# Patient Record
Sex: Female | Born: 1935 | Race: White | Hispanic: No
Health system: Southern US, Community
[De-identification: ages and names within clinical notes are randomized; demographics above are authoritative.]

---

## 2005-01-06 ENCOUNTER — Ambulatory Visit: Payer: Self-pay | Admitting: Internal Medicine

## 2005-10-10 ENCOUNTER — Ambulatory Visit: Payer: Self-pay

## 2005-10-14 ENCOUNTER — Encounter: Payer: Self-pay | Admitting: General Practice

## 2005-10-21 ENCOUNTER — Ambulatory Visit: Payer: Self-pay | Admitting: Unknown Physician Specialty

## 2005-10-29 ENCOUNTER — Inpatient Hospital Stay: Payer: Self-pay | Admitting: Unknown Physician Specialty

## 2005-10-29 ENCOUNTER — Other Ambulatory Visit: Payer: Self-pay

## 2006-03-02 ENCOUNTER — Ambulatory Visit: Payer: Self-pay | Admitting: Internal Medicine

## 2006-06-26 ENCOUNTER — Other Ambulatory Visit: Payer: Self-pay

## 2006-06-27 ENCOUNTER — Inpatient Hospital Stay: Payer: Self-pay | Admitting: Internal Medicine

## 2006-10-30 IMAGING — CR DG CHEST 2V
1 series · 2 of 2 positions shown · non-contrast
Comparison: none

REASON FOR EXAM: HTN, cancer.   Call report to SDS [id number]
COMMENTS:

[Series 1: view not recorded · 0.17mm/px · 2 of 2 slices shown]
[im 1/2]
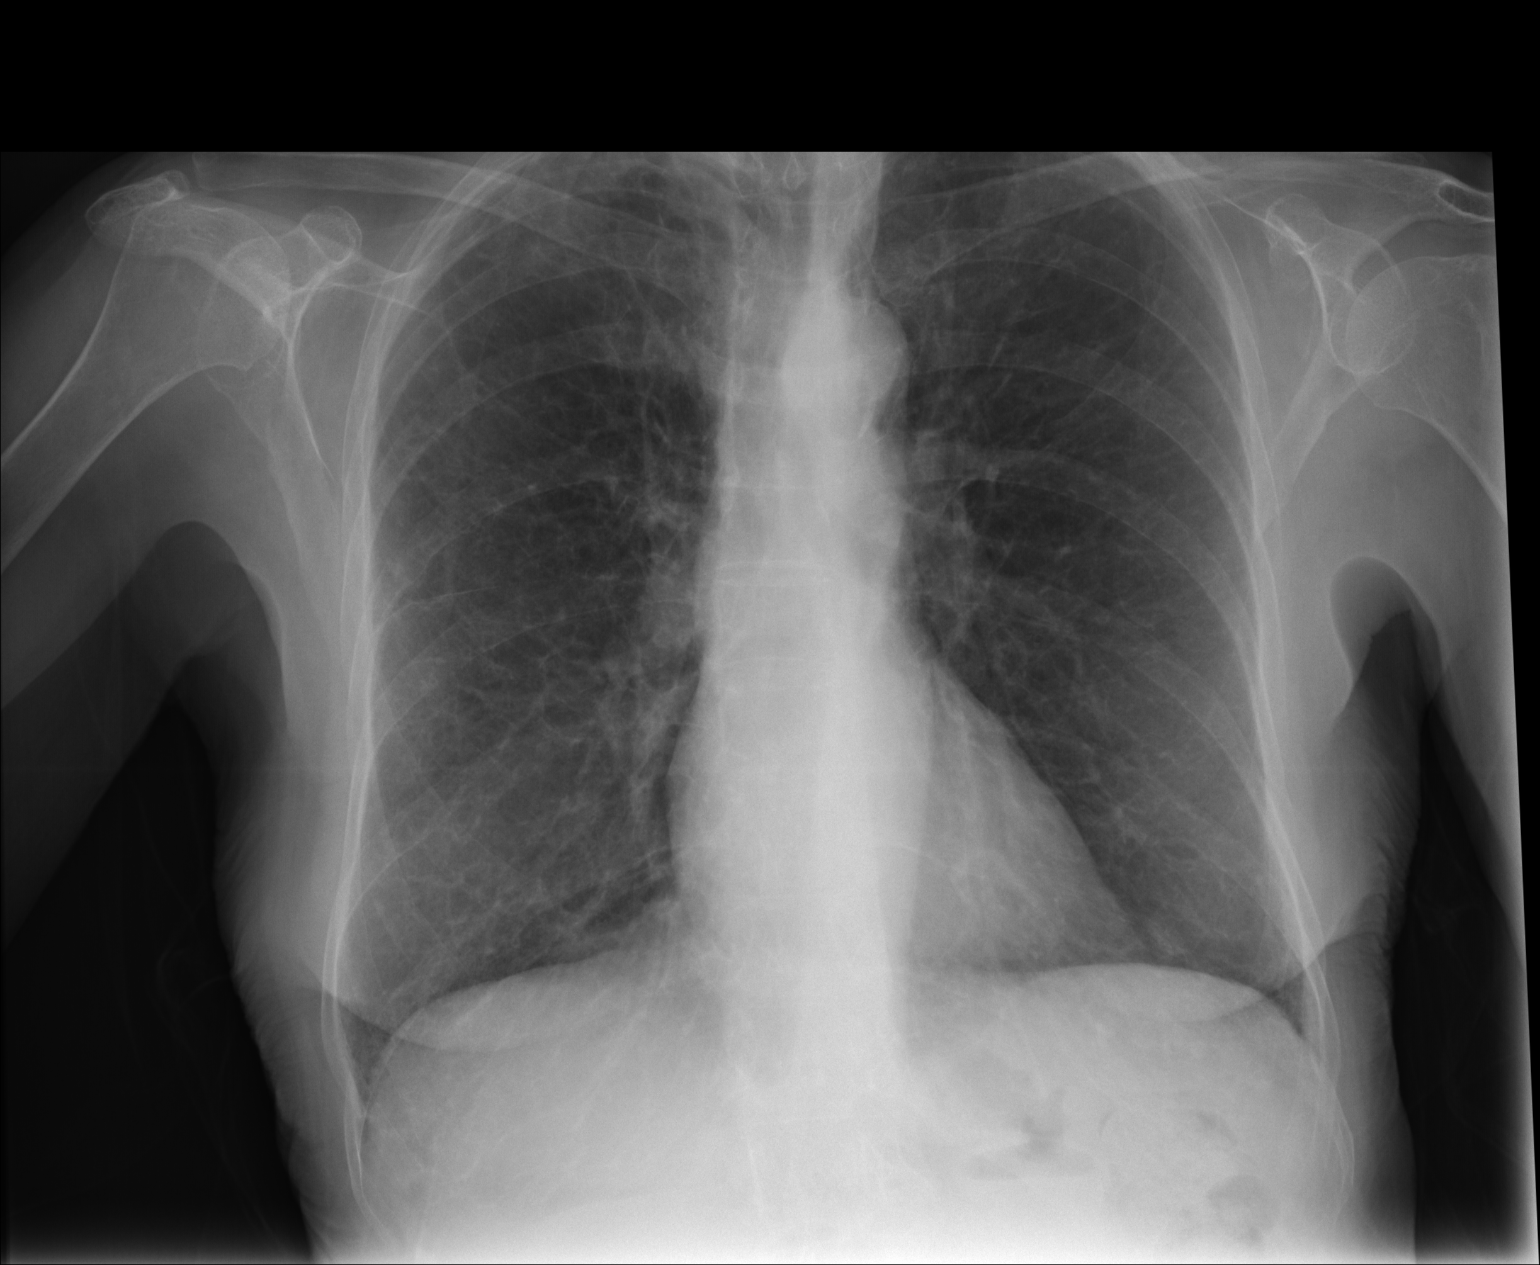
[im 2/2]
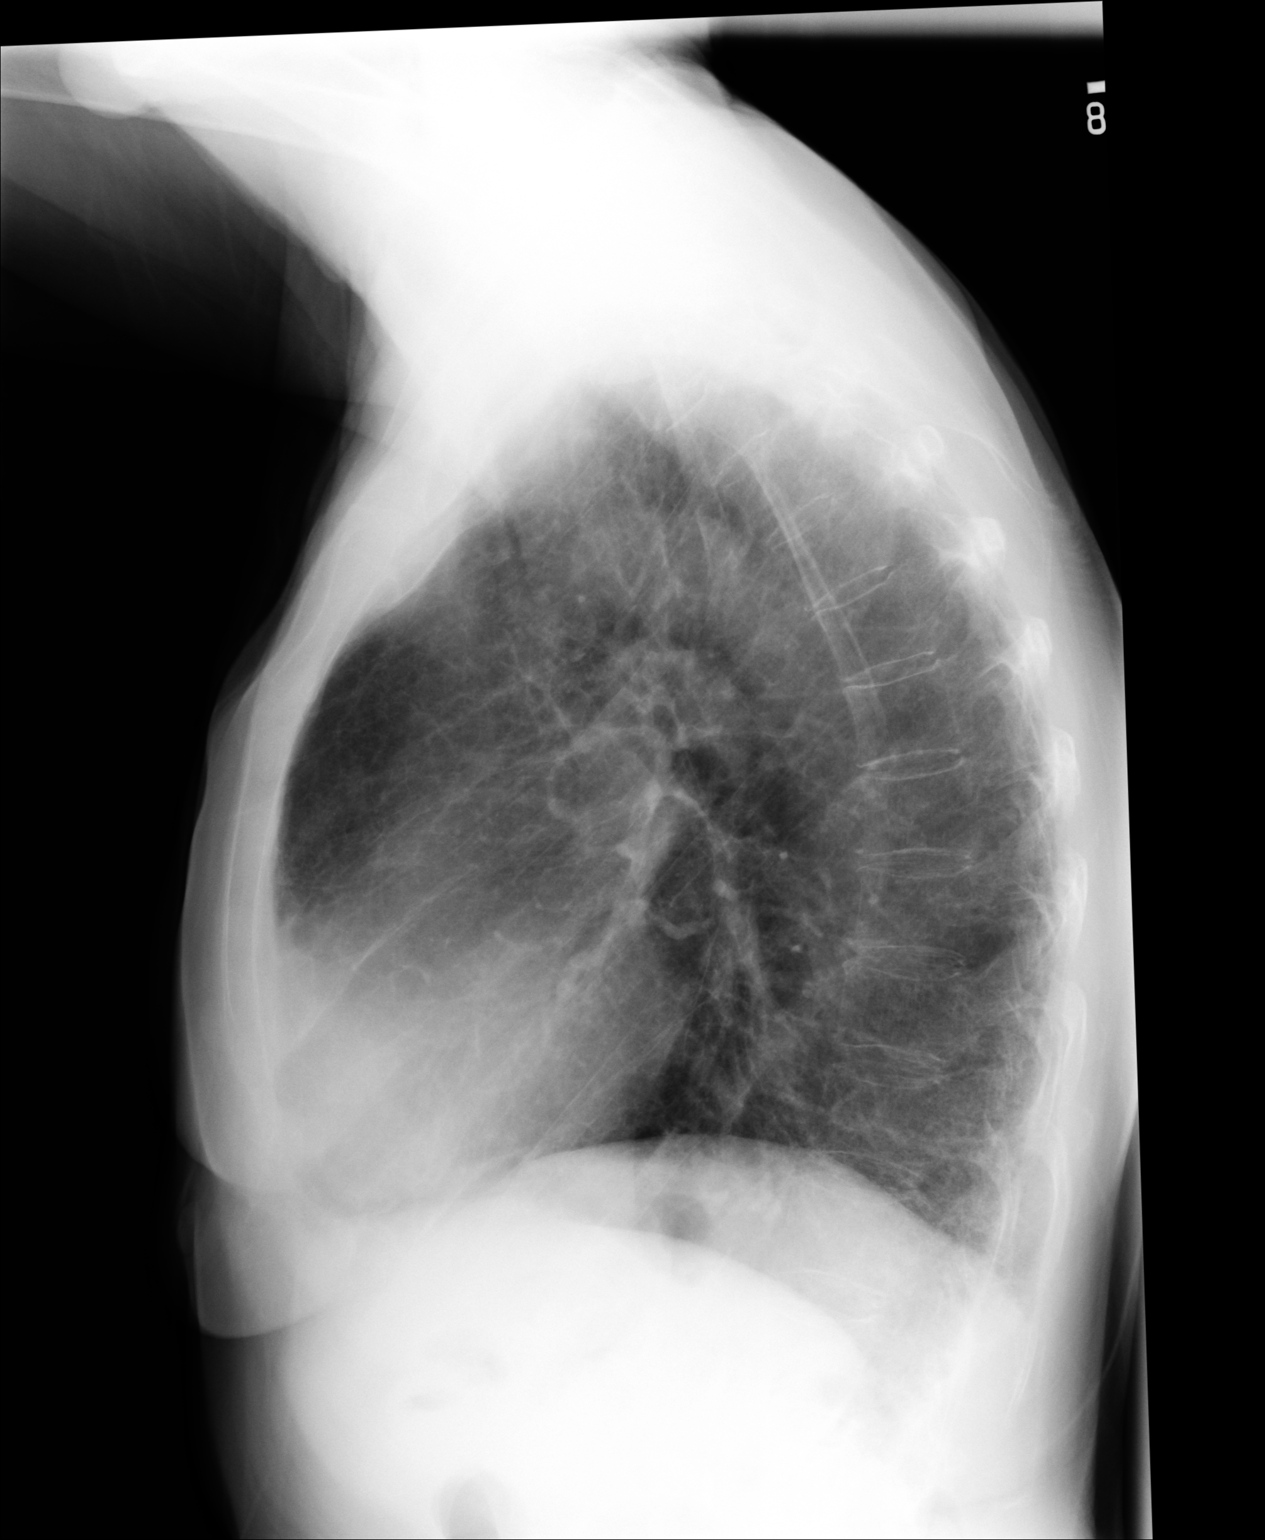

[2 of 2 positions shown; findings below may reference images not displayed]

PROCEDURE:     DXR - DXR CHEST PA (OR AP) AND LATERAL  - October 29, 2005 [DATE]

RESULT:        The lungs are mildly hyperinflated.  There is no focal
infiltrate.  The heart and pulmonary vascularity are normal.  There is mild
tortuosity of the descending thoracic aorta.  Minimal apical pleural
thickening is present bilaterally.  The bones are osteopenic.  I see no
thoracic vertebral body compression.
IMPRESSION: There are findings consistent with COPD.  I do not see evidence of
congestive heart failure nor of pneumonia.  There are no findings suspicious
for metastatic disease.

## 2006-12-24 ENCOUNTER — Ambulatory Visit: Payer: Self-pay

## 2007-02-03 ENCOUNTER — Other Ambulatory Visit: Payer: Self-pay

## 2007-02-03 ENCOUNTER — Ambulatory Visit: Payer: Self-pay | Admitting: General Practice

## 2007-02-11 ENCOUNTER — Ambulatory Visit: Payer: Self-pay | Admitting: Internal Medicine

## 2007-03-01 ENCOUNTER — Ambulatory Visit: Payer: Self-pay | Admitting: General Practice

## 2007-03-02 ENCOUNTER — Ambulatory Visit: Payer: Self-pay | Admitting: Internal Medicine

## 2007-03-11 ENCOUNTER — Encounter: Payer: Self-pay | Admitting: General Practice

## 2007-04-01 ENCOUNTER — Encounter: Payer: Self-pay | Admitting: General Practice

## 2007-05-22 ENCOUNTER — Ambulatory Visit: Payer: Self-pay | Admitting: Emergency Medicine

## 2007-05-26 ENCOUNTER — Ambulatory Visit: Payer: Self-pay | Admitting: Physician Assistant

## 2007-06-01 ENCOUNTER — Emergency Department: Payer: Self-pay | Admitting: Emergency Medicine

## 2007-06-09 ENCOUNTER — Ambulatory Visit: Payer: Self-pay | Admitting: Unknown Physician Specialty

## 2007-06-09 ENCOUNTER — Other Ambulatory Visit: Payer: Self-pay

## 2007-06-15 ENCOUNTER — Inpatient Hospital Stay: Payer: Self-pay | Admitting: Unknown Physician Specialty

## 2007-10-02 ENCOUNTER — Ambulatory Visit: Payer: Self-pay | Admitting: Internal Medicine

## 2007-10-07 ENCOUNTER — Ambulatory Visit: Payer: Self-pay | Admitting: Unknown Physician Specialty

## 2007-10-14 ENCOUNTER — Ambulatory Visit: Payer: Self-pay | Admitting: Internal Medicine

## 2007-10-18 ENCOUNTER — Ambulatory Visit: Payer: Self-pay | Admitting: Unknown Physician Specialty

## 2007-10-19 ENCOUNTER — Ambulatory Visit: Payer: Self-pay | Admitting: Internal Medicine

## 2007-11-01 ENCOUNTER — Ambulatory Visit: Payer: Self-pay | Admitting: Internal Medicine

## 2007-12-02 ENCOUNTER — Ambulatory Visit: Payer: Self-pay | Admitting: Internal Medicine

## 2007-12-06 ENCOUNTER — Ambulatory Visit: Payer: Self-pay | Admitting: Unknown Physician Specialty

## 2007-12-10 ENCOUNTER — Ambulatory Visit: Payer: Self-pay | Admitting: Internal Medicine

## 2007-12-17 ENCOUNTER — Emergency Department: Payer: Self-pay | Admitting: Emergency Medicine

## 2008-01-02 ENCOUNTER — Ambulatory Visit: Payer: Self-pay | Admitting: Internal Medicine

## 2008-02-29 ENCOUNTER — Ambulatory Visit: Payer: Self-pay | Admitting: Internal Medicine

## 2008-03-01 ENCOUNTER — Ambulatory Visit: Payer: Self-pay | Admitting: Internal Medicine

## 2008-03-15 ENCOUNTER — Ambulatory Visit: Payer: Self-pay | Admitting: Internal Medicine

## 2008-03-20 ENCOUNTER — Ambulatory Visit: Payer: Self-pay | Admitting: Internal Medicine

## 2008-03-31 ENCOUNTER — Ambulatory Visit: Payer: Self-pay | Admitting: Internal Medicine

## 2009-03-01 ENCOUNTER — Ambulatory Visit: Payer: Self-pay | Admitting: Internal Medicine

## 2010-07-03 ENCOUNTER — Ambulatory Visit: Payer: Self-pay | Admitting: Ophthalmology

## 2011-04-24 ENCOUNTER — Encounter: Payer: Self-pay | Admitting: Nurse Practitioner

## 2011-04-24 ENCOUNTER — Encounter: Payer: Self-pay | Admitting: Cardiothoracic Surgery

## 2011-05-02 ENCOUNTER — Encounter: Payer: Self-pay | Admitting: Nurse Practitioner

## 2011-05-02 ENCOUNTER — Encounter: Payer: Self-pay | Admitting: Cardiothoracic Surgery

## 2011-06-01 ENCOUNTER — Encounter: Payer: Self-pay | Admitting: Cardiothoracic Surgery

## 2011-07-02 ENCOUNTER — Encounter: Payer: Self-pay | Admitting: Cardiothoracic Surgery

## 2011-07-30 ENCOUNTER — Encounter (HOSPITAL_BASED_OUTPATIENT_CLINIC_OR_DEPARTMENT_OTHER): Payer: Medicare Other | Attending: Plastic Surgery

## 2011-07-30 DIAGNOSIS — L8990 Pressure ulcer of unspecified site, unspecified stage: Secondary | ICD-10-CM | POA: Insufficient documentation

## 2011-07-30 DIAGNOSIS — M329 Systemic lupus erythematosus, unspecified: Secondary | ICD-10-CM | POA: Insufficient documentation

## 2011-07-30 DIAGNOSIS — Z79899 Other long term (current) drug therapy: Secondary | ICD-10-CM | POA: Insufficient documentation

## 2011-07-30 DIAGNOSIS — Z7982 Long term (current) use of aspirin: Secondary | ICD-10-CM | POA: Insufficient documentation

## 2011-07-30 DIAGNOSIS — Z8673 Personal history of transient ischemic attack (TIA), and cerebral infarction without residual deficits: Secondary | ICD-10-CM | POA: Insufficient documentation

## 2011-07-30 DIAGNOSIS — J449 Chronic obstructive pulmonary disease, unspecified: Secondary | ICD-10-CM | POA: Insufficient documentation

## 2011-07-30 DIAGNOSIS — K219 Gastro-esophageal reflux disease without esophagitis: Secondary | ICD-10-CM | POA: Insufficient documentation

## 2011-07-30 DIAGNOSIS — I10 Essential (primary) hypertension: Secondary | ICD-10-CM | POA: Insufficient documentation

## 2011-07-30 DIAGNOSIS — J4489 Other specified chronic obstructive pulmonary disease: Secondary | ICD-10-CM | POA: Insufficient documentation

## 2011-07-30 DIAGNOSIS — L89109 Pressure ulcer of unspecified part of back, unspecified stage: Secondary | ICD-10-CM | POA: Insufficient documentation

## 2011-07-30 NOTE — Progress Notes (Signed)
[facility]  NAME:  Tabitha Parsons NO.:  [id number]  MEDICAL RECORD NO.:  [id number]      DATE OF BIRTH:  1929-10-01  PHYSICIAN:  Wayland Denis, DO       VISIT DATE:  07/30/2011                                  OFFICE VISIT   CHIEF COMPLAINT:  Sacral ulcer.  HISTORY OF PRESENT ILLNESS:  [name] is a 77 year old white female here with her daughter for consultation for sacral ulcer.  According to the patient, she fell and broke her hip in October 2011.  While in the hospital for her hip repair, she developed a sacral pressure sore that developed into an ulcer.  She has been debrided, treated with Vac, a number of different products had been tried, and she has had a very difficult time healing this.  She has been treated with antibiotics in the past and she does have an air mattress at home, but her daughter is not sure what kind of an air mattress.  She is currently smoking and is on her wound at some points during the day.  PAST MEDICAL HISTORY:  Hypertension, gastroesophageal reflux disease, cerebrovascular accident, lupus, chronic obstructive pulmonary disease, arthritis, and cataracts.  SURGICAL HISTORY:  Uterine cancer, had a hysterectomy, shoulder surgery, hip surgery, complex fracture of her back with kyphoplasty, and bilateral cataracts.  MEDICATIONS:  Aspirin, calcium, magnesium, timolol, tramadol, potassium, hydrochlorothiazide, colestipol, atenolol, omeprazole, amlodipine, lovastatin, latanoprost, zinc, and vitamin D3.  ALLERGIES:  PENICILLIN and CODEINE.  SOCIAL HISTORY:  Lives at home and smokes about a pack a day of cigarettes.  FAMILY HISTORY:  Noncontributory.  REVIEW OF SYSTEMS:  She does not state any blood in her urine or stool, difficulty breathing, chest pain, or change in her weight.  PHYSICAL EXAMINATION:  GENERAL:  She is alert and oriented, cooperative, not in any acute distress.  She is pleasant.  She  seems to be a good historian and aware of her condition. HEENT:  Her pupils are equal, but small.  Extraocular muscles are intact.  No cervical lymphadenopathy. CHEST:  Breathing is unlabored. HEART:  Regular. ABDOMEN:  Soft.  No organomegaly noted.  The lesion exact size of the sacral ulcer noted in the chart.  She has a pale, pink color to the sacral ulcer.  It does not appear to be infected.  There is no bone exposed.  It is a little bit hypergranulating.  There does appear to be some epithelialization at the 2 to 3 o'clock position.  We had a long discussion about the possibility of a local flap, which would be an option for her because she is mobile. She is not paralyzed as long as she does not have another inciting event.  She would likely not develop another ulcer.  However, she is smoking too much to have a surgical flap created.  The risk is too high and I expressed this to her that she must stop smoking and be smoke free for 6 weeks prior to doing a flap surgery on her.  In the meantime, I recommend collagen treatment with zinc to help with any antibiotic means, and we will have her follow up in 1 week with collagen dressing changes.  Once we get things stabilize,  she can go to [facility] for followup as it is closer and much more ideal for her situation.     Wayland Denis, DO     CS/MEDQ  D:  07/30/2011  T:  07/30/2011  Job:  [id number]

## 2011-08-02 ENCOUNTER — Encounter: Payer: Self-pay | Admitting: Cardiothoracic Surgery

## 2011-08-06 ENCOUNTER — Encounter: Payer: Self-pay | Admitting: Nurse Practitioner

## 2011-08-13 ENCOUNTER — Encounter (HOSPITAL_BASED_OUTPATIENT_CLINIC_OR_DEPARTMENT_OTHER): Payer: Medicare Other | Attending: Plastic Surgery

## 2011-08-13 DIAGNOSIS — Z8673 Personal history of transient ischemic attack (TIA), and cerebral infarction without residual deficits: Secondary | ICD-10-CM | POA: Insufficient documentation

## 2011-08-13 DIAGNOSIS — K219 Gastro-esophageal reflux disease without esophagitis: Secondary | ICD-10-CM | POA: Insufficient documentation

## 2011-08-13 DIAGNOSIS — J4489 Other specified chronic obstructive pulmonary disease: Secondary | ICD-10-CM | POA: Insufficient documentation

## 2011-08-13 DIAGNOSIS — I10 Essential (primary) hypertension: Secondary | ICD-10-CM | POA: Insufficient documentation

## 2011-08-13 DIAGNOSIS — M329 Systemic lupus erythematosus, unspecified: Secondary | ICD-10-CM | POA: Insufficient documentation

## 2011-08-13 DIAGNOSIS — Z7982 Long term (current) use of aspirin: Secondary | ICD-10-CM | POA: Insufficient documentation

## 2011-08-13 DIAGNOSIS — L8990 Pressure ulcer of unspecified site, unspecified stage: Secondary | ICD-10-CM | POA: Insufficient documentation

## 2011-08-13 DIAGNOSIS — L89109 Pressure ulcer of unspecified part of back, unspecified stage: Secondary | ICD-10-CM | POA: Insufficient documentation

## 2011-08-13 DIAGNOSIS — Z79899 Other long term (current) drug therapy: Secondary | ICD-10-CM | POA: Insufficient documentation

## 2011-08-13 DIAGNOSIS — J449 Chronic obstructive pulmonary disease, unspecified: Secondary | ICD-10-CM | POA: Insufficient documentation

## 2011-09-01 ENCOUNTER — Encounter: Payer: Self-pay | Admitting: Nurse Practitioner

## 2011-09-01 ENCOUNTER — Encounter: Payer: Self-pay | Admitting: Cardiothoracic Surgery

## 2011-10-02 ENCOUNTER — Encounter: Payer: Self-pay | Admitting: Nurse Practitioner

## 2011-10-02 ENCOUNTER — Encounter: Payer: Self-pay | Admitting: Cardiothoracic Surgery

## 2011-11-26 ENCOUNTER — Emergency Department: Payer: Self-pay | Admitting: Emergency Medicine

## 2011-11-26 ENCOUNTER — Ambulatory Visit: Payer: Self-pay | Admitting: Internal Medicine

## 2011-11-27 ENCOUNTER — Ambulatory Visit: Payer: Self-pay | Admitting: Internal Medicine

## 2011-12-04 ENCOUNTER — Ambulatory Visit: Payer: Self-pay | Admitting: Internal Medicine

## 2011-12-05 ENCOUNTER — Ambulatory Visit: Payer: Self-pay | Admitting: Oncology

## 2011-12-09 ENCOUNTER — Ambulatory Visit: Payer: Self-pay | Admitting: Internal Medicine

## 2011-12-09 LAB — PROTIME-INR: Prothrombin Time: 13.7 secs (ref 11.5–14.7)

## 2011-12-09 LAB — TSH: Thyroid Stimulating Horm: 2.18 u[IU]/mL

## 2011-12-09 LAB — APTT: Activated PTT: 44.5 secs — ABNORMAL HIGH (ref 23.6–35.9)

## 2011-12-12 ENCOUNTER — Ambulatory Visit: Payer: Self-pay | Admitting: Internal Medicine

## 2012-01-02 ENCOUNTER — Ambulatory Visit: Payer: Self-pay | Admitting: Oncology

## 2012-01-02 ENCOUNTER — Ambulatory Visit: Payer: Self-pay | Admitting: Internal Medicine

## 2012-01-02 DEATH — deceased

## 2012-12-04 IMAGING — PT NM PET TUM IMG RESTAG (PS) SKULL BASE T - THIGH
1 series · 1 of 1 positions shown · non-contrast
Comparison: none

REASON FOR EXAM: restage Ovarian CA new rt lung mass with invasion into
rib
COMMENTS:

PROCEDURE:     PET - PET/CT RSTG OVARY/UTERINE ADNEXI  - December 04, 2011  [DATE]
RESULT:     Indication: Ovarian cancer
Radiopharmaceutical: 12.01 mCi F18-FDG, intravenously.
TECHNIQUE: Imaging was performed from the skull base to the mid-thigh using
routine PET/CT acquisition protocol.
Injection site: Left antecubital
Time of FDG injection: 9893 hours
Serum glucose: 116 mg/dL
Time of imaging: 5393 hours
Comparison studies: None

[Series 1012: mm oncology reading_mm oncology reading result ima · 1.31mm/px · 1 of 1 slices shown]
[im 1/1]
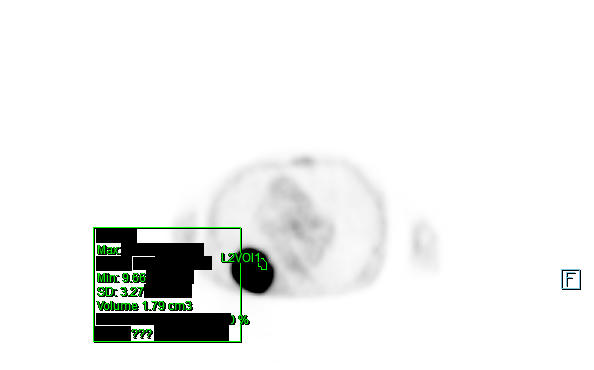

[1 of 1 positions shown; findings below may reference images not displayed]

FINDINGS: HEAD AND NECK:

There is no abnormal hypermetabolic activity in the head and neck. There is
no cervical soft tissue mass or lymphadenopathy.

CHEST:

There is a intensely hypermetabolic cavitating 6.3 cm mass in the superior
segment of the right lower lobe abutting the major fissure an SUV max of
33.46 and an SUV average of 24.83 . There is adjacent rib destruction of the
posterior the a rib. There is adjacent pneumonitis. There bilateral severe
emphysematous changes.

The heart size is normal. There is no pericardial effusion. There is
coronary artery atherosclerosis.

There is a mildly enlarged mildly hypermetabolic subcarinal lymph node with
an SUV max of 2.8 and an SUV average of 2.4 .

ABDOMEN/PELVIS:

The liver demonstrates no focal abnormality. The gallbladder is
unremarkable. The spleen demonstrates no focal abnormality. The kidneys,
adrenal glands, pancreas are normal. The bladder is unremarkable.

The unopacified bowel is unremarkable. There is no pneumoperitoneum,
pneumatosis, or portal venous gas. There is no abdominal or pelvic free
fluid. There is no lymphadenopathy.

The abdominal aorta is normal in caliber with atherosclerosis.

There is patchy sclerosis of the posterior ilium and sacrum bilaterally
likely secondary to prior methylmethacrylate injection. There is
extravasated methylmethacrylate in the right-sided pelvis suggesting that
appearance of the ilium and sacrum is secondary to prior injection of
methylmethacrylate. There is evidence of prior L5 vertebroplasty.
IMPRESSION: 1. Intensely hypermetabolic cavitating right lower lobe mass with adjacent
bone destruction most concerning for primary lung malignancy. There is a
mildly hypermetabolic subcarinal lymph node which may be reactive versus
secondary to nodal metastasis.

## 2012-12-12 IMAGING — CR DG CHEST 1V PORT
1 series · 1 of 1 positions shown · non-contrast
Comparison: none

REASON FOR EXAM: post lung biopsy-right lung mass
COMMENTS:

[portable]
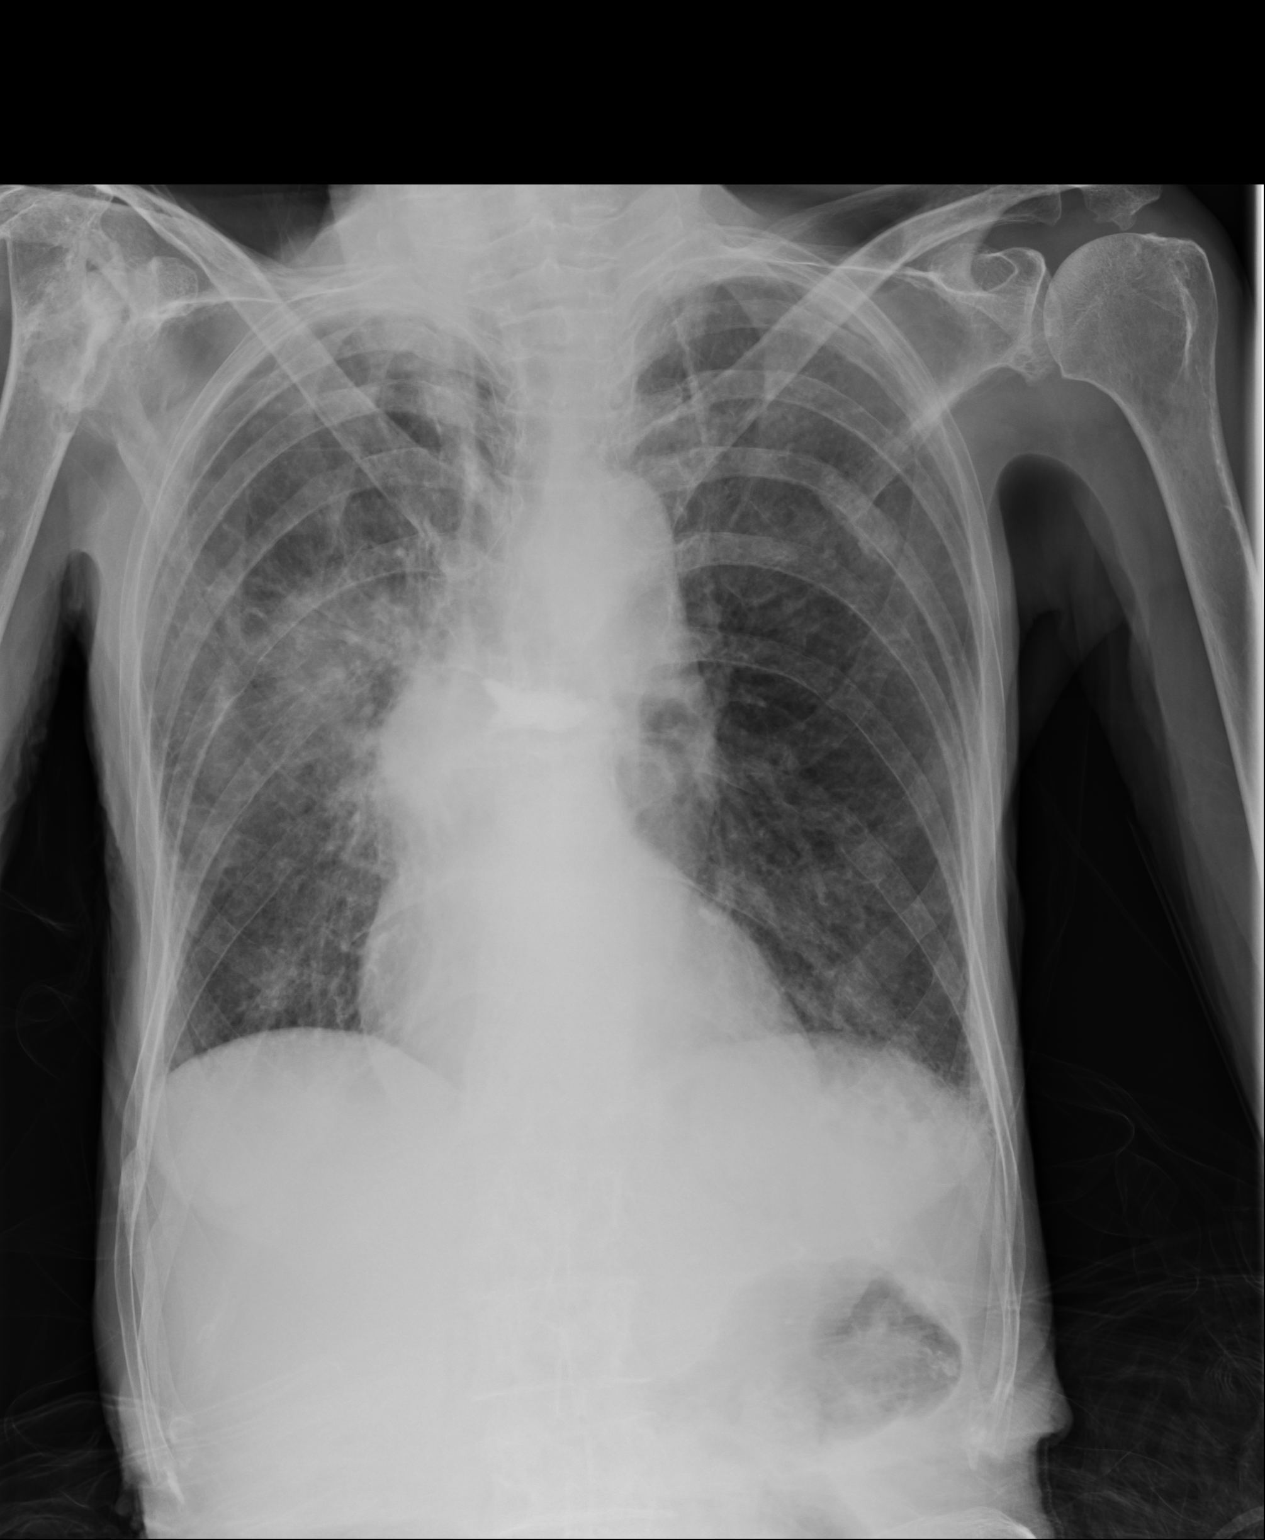

[1 of 1 positions shown; findings below may reference images not displayed]

PROCEDURE:     DXR - DXR PORTABLE CHEST SINGLE VIEW  - December 12, 2011 [DATE]

RESULT:     Comparison is made to the prior exam of 11/26/2011. There is
again noted increased density about the right hilum consistent with the
patient's known right hilar mass. No postbiopsy pneumothorax or pleural
effusion is seen.

Incidental note is again made of apparent destructive change of the sixth
left posterior rib, suspicious for metastatic disease. Heart size is normal.
No pleural effusion is seen.
IMPRESSION: 1. No postbiopsy pneumothorax is identified.

## 2015-03-25 NOTE — Consult Note (Signed)
Reason for Visit: This 81 year old Female patient presents to the clinic for initial evaluation of  Lung cancer .   Referred by Dr. Orlie Dakin.  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 She is having a followup surveillance scan showing a right lower lobe mass invading the right chest wall. Biopsy was obtained and positive for squamous cell carcinoma. PET/CT scan showed avid uptake in the mass in the right lower lobe no other evidence of mediastinal adenopathy or distant metastatic disease was noted. Patient is rather frailand has been evaluated by medical oncology. She is not a surgical candidate. She specifically denies marked shortness of breath cough or hemoptysis.  Past Hx:    HX OF DVT:    Osteoporosis:    Emphysema:    COPD:    Multi-drug Resistant Organism (MDRO): Positive culture for MRSA., 13-Jun-2011   arthritis:    htn:    Lumpectomy: breast   kyphoplasty [id number]:    hysterectomy:    kyphoplasty: Nov 2006  Past, Family and Social History:   Past Medical History positive    Cardiovascular hypertension    Respiratory asthma; COPD; Emphysema    Past Surgical History Kyphoplasty, hysterectomy, lumpectomy, total of, hysterectomy for uterine cancer    Past Medical History Comments History of DVT, osteoporosis    Family History positive    Family History Comments Sister with breast cancer another sister with pulmonary embolus brother with lymphoma    Social History noncontributory   Allergies:   PCN: Unknown  Codeine: Unknown  Home Meds:  Home Medications: Medication  Instructions Status  acetaminophen-HYDROcodone 325 mg-5 mg tablet 1- 2 tab(s) orally every 4- 6 hours as needed for pain Active  promethazine 25 mg oral tablet 1 tab(s) orally every 6 hours x 30 days, As Needed, ,for Nausea, Vomiting Active  Duragesic-75 75 mcg/hr film, extended release 1 PATCH transdermal every 72 hours x 30 days Active  Duragesic-50 transdermal film, extended release 1  transdermal every 72 hours x 30 days Active  Vitamin D3 400 intl units oral tablet 1 tab(s) orally once a day Active  cyanocobalamin 500 mcg oral tablet 1 tab(s) orally once a day Active  stool softner otc 100 mg 1 tab(s)  , As Needed- for Constipation  Active  latanoprost 0.005% ophthalmic solution 1 drop(s) both eyes once a day (at bedtime) Active  Combigan 0.2%-0.5% ophthalmic solution 1 drop(s) in each eye 2 times a day Active  potassium chloride 10 mEq oral tablet, extended release 1 tab(s) orally 2 times a day Active  hydrochlorothiazide 25 mg oral tablet 1 tab(s) orally once a day (in the morning) for blood pressure. Active  colestipol 1 g oral tablet 1 tab(s) orally once a day Active  omeprazole 20 mg oral delayed release capsule 1 cap(s) orally once a day (in the morning) Active  amlodipine 5 mg oral tablet 1 tab(s) orally once a day Active  lovastatin 40 mg oral tablet 1 tab(s) orally once a day (at bedtime) Active  ProAir HFA 90 mcg/inh inhalation aerosol 2 puff(s) inhaled every 4 hours as needed. Active  atenolol 25  mg oral tablet 1 tab(s) orally once a day Active   Review of Systems:   General negative    Performance Status (ECOG) 1    Skin negative    Breast see HPI    Ophthalmologic negative    ENMT negative    Respiratory and Thorax see HPI    Cardiovascular see HPI    Gastrointestinal negative    Genitourinary negative    Musculoskeletal negative    Neurological negative    Psychiatric negative    Hematology/Lymphatics negative    Endocrine negative     Allergic/Immunologic negative   Nursing Notes:  Nursing Vital Signs and Chemo Nursing Nursing Notes: *CC Vital Signs Flowsheet:   17-Jan-13 10:57   Temp Temperature 95.7   Pulse Pulse 89   Respirations Respirations 20   SBP SBP 133   DBP DBP 75   Pain Scale (0-10)  8   Height (cm) centimeters 132   Physical Exam:  General/Skin/HEENT:   General normal    Skin normal    Eyes normal    ENMT normal    Head and Neck normal    Additional PE Well-developed thin elderly lady in poor performance status. Lungs are clear to A&P cardiac examination shows regular rate and rhythm. Abdomen is benign. No pain is elicited on palpation her right posterior chest wall.   Breasts/Resp/CV/GI/GU:   Respiratory and Thorax normal    Cardiovascular normal    Gastrointestinal normal    Genitourinary normal   MS/Neuro/Psych/Lymph:   Musculoskeletal normal    Neurological normal    Lymphatics normal   Assessment and Plan:  Impression:   clinical stage IIb squamous carcinoma right lower lobe an 81 year old female with poor performance status and multiple comorbidities  Plan:   at this time I would offer guided radiation therapy. I would plan on a split course of treatment to her chest. Plan on delivering 4000 cGy over 4 weeks and evaluate for patient tolerance as well as response. We would then be able to possibly boost this area should we show significant response. Risks and benefits of radiation were discussed with the patient and she seems to comprehend our treatment plan well. Based on her advanced age and chest wall involvement she is not a surgical candidate and we did review that. I discussed the case personally with Dr. Orlie DakinFinnegan. He may offer concurrent chemotherapy. CT simulation was given the following week.  I would like to take this opportunity to thank you for allowing me to continue to participate in this patient's care.  Electronic Signatures: Rebeca Alerthrystal, Lucile Didonato S (MD)  (Signed  07-Feb-13 14:10)  Authored: HPI, Diagnosis, Past Hx, PFSH, Allergies, Home Meds, ROS, Nursing Notes, Physical Exam, Encounter Assessment and Plan   Last Updated: 07-Feb-13 14:10 by Rebeca Alerthrystal, Cornellius Kropp S (MD)
